# Patient Record
Sex: Female | Born: 1965 | Race: White | Hispanic: No | Smoking: Current every day smoker
Health system: Southern US, Community
[De-identification: ages and names within clinical notes are randomized; demographics above are authoritative.]

## PROBLEM LIST (undated history)

## (undated) DIAGNOSIS — M1990 Unspecified osteoarthritis, unspecified site: Secondary | ICD-10-CM

## (undated) DIAGNOSIS — J449 Chronic obstructive pulmonary disease, unspecified: Secondary | ICD-10-CM

## (undated) HISTORY — PX: CORONARY ANGIOPLASTY WITH STENT PLACEMENT: SHX49

---

## 2003-08-13 ENCOUNTER — Other Ambulatory Visit: Payer: Self-pay

## 2004-05-13 ENCOUNTER — Emergency Department: Payer: Self-pay | Admitting: Emergency Medicine

## 2005-08-19 ENCOUNTER — Emergency Department: Payer: Self-pay | Admitting: Emergency Medicine

## 2008-06-22 ENCOUNTER — Emergency Department: Payer: Self-pay | Admitting: Emergency Medicine

## 2008-10-19 ENCOUNTER — Ambulatory Visit: Payer: Self-pay

## 2008-11-18 ENCOUNTER — Ambulatory Visit: Payer: Self-pay | Admitting: General Practice

## 2010-04-17 ENCOUNTER — Ambulatory Visit: Payer: Self-pay

## 2012-03-12 ENCOUNTER — Emergency Department: Payer: Self-pay | Admitting: Internal Medicine

## 2015-01-03 ENCOUNTER — Other Ambulatory Visit: Payer: Self-pay | Admitting: Medical Oncology

## 2015-01-03 DIAGNOSIS — Z1231 Encounter for screening mammogram for malignant neoplasm of breast: Secondary | ICD-10-CM

## 2015-01-12 ENCOUNTER — Ambulatory Visit: Payer: Self-pay

## 2015-01-18 ENCOUNTER — Ambulatory Visit: Payer: Self-pay | Attending: Medical Oncology

## 2015-02-17 ENCOUNTER — Encounter: Payer: Self-pay | Admitting: *Deleted

## 2015-02-17 ENCOUNTER — Emergency Department: Payer: Self-pay

## 2015-02-17 ENCOUNTER — Emergency Department
Admission: EM | Admit: 2015-02-17 | Discharge: 2015-02-17 | Disposition: A | Discharge status: Home / Self Care | Payer: Self-pay | Attending: Emergency Medicine | Admitting: Emergency Medicine

## 2015-02-17 DIAGNOSIS — Y9389 Activity, other specified: Secondary | ICD-10-CM | POA: Insufficient documentation

## 2015-02-17 DIAGNOSIS — Y9289 Other specified places as the place of occurrence of the external cause: Secondary | ICD-10-CM | POA: Insufficient documentation

## 2015-02-17 DIAGNOSIS — Y998 Other external cause status: Secondary | ICD-10-CM | POA: Insufficient documentation

## 2015-02-17 DIAGNOSIS — Z79899 Other long term (current) drug therapy: Secondary | ICD-10-CM | POA: Insufficient documentation

## 2015-02-17 DIAGNOSIS — S60051A Contusion of right little finger without damage to nail, initial encounter: Secondary | ICD-10-CM | POA: Insufficient documentation

## 2015-02-17 DIAGNOSIS — F17200 Nicotine dependence, unspecified, uncomplicated: Secondary | ICD-10-CM | POA: Insufficient documentation

## 2015-02-17 DIAGNOSIS — S6000XA Contusion of unspecified finger without damage to nail, initial encounter: Secondary | ICD-10-CM

## 2015-02-17 DIAGNOSIS — W01198A Fall on same level from slipping, tripping and stumbling with subsequent striking against other object, initial encounter: Secondary | ICD-10-CM | POA: Insufficient documentation

## 2015-02-17 DIAGNOSIS — S60221A Contusion of right hand, initial encounter: Secondary | ICD-10-CM | POA: Insufficient documentation

## 2015-02-17 HISTORY — DX: Chronic obstructive pulmonary disease, unspecified: J44.9

## 2015-02-17 HISTORY — DX: Unspecified osteoarthritis, unspecified site: M19.90

## 2015-02-17 MED ORDER — TRAMADOL HCL 50 MG PO TABS: 50.0000 mg | ORAL_TABLET | Freq: Four times a day (QID) | ORAL | Status: DC | PRN

## 2015-02-17 NOTE — Discharge Instructions (Signed)
Contusion A contusion is a deep bruise. Contusions happen when an injury causes bleeding under the skin. Symptoms of bruising include pain, swelling, and discolored skin. The skin may turn blue, purple, or yellow. HOME CARE   Rest the injured area.  If told, put ice on the injured area.  Put ice in a plastic bag.  Place a towel between your skin and the bag.  Leave the ice on for 20 minutes, 2-3 times per day.  If told, put light pressure (compression) on the injured area using an elastic bandage. Make sure the bandage is not too tight. Remove it and put it back on as told by your doctor.  If possible, raise (elevate) the injured area above the level of your heart while you are sitting or lying down.  Take over-the-counter and prescription medicines only as told by your doctor. GET HELP IF:  Your symptoms do not get better after several days of treatment.  Your symptoms get worse.  You have trouble moving the injured area. GET HELP RIGHT AWAY IF:   You have very bad pain.  You have a loss of feeling (numbness) in a hand or foot.  Your hand or foot turns pale or cold.   This information is not intended to replace advice given to you by your health care provider. Make sure you discuss any questions you have with your health care provider.   Document Released: 06/19/2007 Document Revised: 09/21/2014 Document Reviewed: 05/18/2014 Elsevier Interactive Patient Education 2016 [employer]  Cryotherapy Cryotherapy is when you put ice on your injury. Ice helps lessen pain and puffiness (swelling) after an injury. Ice works the best when you start using it in the first 24 to 48 hours after an injury. HOME CARE  Put a dry or damp towel between the ice pack and your skin.  You may press gently on the ice pack.  Leave the ice on for no more than 10 to 20 minutes at a time.  Check your skin after 5 minutes to make sure your skin is okay.  Rest at least 20 minutes between ice  pack uses.  Stop using ice when your skin loses feeling (numbness).  Do not use ice on someone who cannot tell you when it hurts. This includes small children and people with memory problems (dementia). GET HELP RIGHT AWAY IF:  You have white spots on your skin.  Your skin turns blue or pale.  Your skin feels waxy or hard.  Your puffiness gets worse. MAKE SURE YOU:   Understand these instructions.  Will watch your condition.  Will get help right away if you are not doing well or get worse.   This information is not intended to replace advice given to you by your health care provider. Make sure you discuss any questions you have with your health care provider.   Document Released: 06/19/2007 Document Revised: 03/25/2011 Document Reviewed: 08/23/2010 Elsevier Interactive Patient Education [employer]   Follow-up with your doctor in [place] if any continued problems. Ice and elevate her hand as needed for pain and swelling. Begin taking tramadol as needed for pain.

## 2015-02-17 NOTE — ED Notes (Signed)
States she slipped last pm  Having pain to right hand and right side of head   No LOC  No swelling noted to hand  Positive bruising noted

## 2015-02-17 NOTE — ED Notes (Signed)
Pt states she slipped on some news paper last night and fell, now states right hand pain

## 2015-02-17 NOTE — ED Provider Notes (Signed)
[facility] Emergency Department Provider Note  ____________________________________________  Time seen: Approximately 10:31 AM  I have reviewed the triage vital signs and the nursing notes.   HISTORY  Chief Complaint Fall and Hand Pain   HPI Tina Miles is a 50 y.o. female complaint of right hand. Patient states she slipped on some newspapers last evening and hit the right side of her head and injury to her right hand. She denies any loss of consciousness or headache. She states she immediately got up and has not had any visual changes, nausea or vomiting. She is continued to have pain in her right hand and bruising is apparent this morning. Patient is not taking any over-the-counter medication for her hand pain. She rates her pain is 7 out of 10. Pain is increased when she makes a fist. She denies any paresthesias into her digits.   Past Medical History  Diagnosis Date  . COPD (chronic obstructive pulmonary disease) (HCC)   . Arthritis     There are no active problems to display for this patient.   Past Surgical History  Procedure Laterality Date  . Coronary angioplasty with stent placement      Current Outpatient Rx  Name  Route  Sig  Dispense  Refill  . albuterol (PROVENTIL HFA;VENTOLIN HFA) 108 (90 Base) MCG/ACT inhaler   Inhalation   Inhale into the lungs every 6 (six) hours as needed for wheezing or shortness of breath.         . clonazePAM (KLONOPIN) 1 MG tablet   Oral   Take 1 mg by mouth 3 (three) times daily as needed for anxiety.         Marland Kitchen doxepin (SINEQUAN) 150 MG capsule   Oral   Take 150 mg by mouth at bedtime.         Marland Kitchen oxybutynin (DITROPAN) 5 MG tablet   Oral   Take 5 mg by mouth 4 (four) times daily.         Marland Kitchen venlafaxine (EFFEXOR) 75 MG tablet   Oral   Take 75 mg by mouth 2 (two) times daily.         . traMADol (ULTRAM) 50 MG tablet   Oral   Take 1 tablet (50 mg total) by mouth every 6 (six) hours as  needed.   20 tablet   0     Allergies Novocain  History reviewed. No pertinent family history.  Social History Social History  Substance Use Topics  . Smoking status: Current Every Day Smoker  . Smokeless tobacco: None  . Alcohol Use: No    Review of Systems Constitutional: No fever/chills Eyes: No visual changes. ENT: No trauma Cardiovascular: Denies chest pain. Respiratory: Denies shortness of breath. Gastrointestinal: No abdominal pain.  No nausea, no vomiting.   Musculoskeletal: Positive right hand pain. Skin: Negative for rash. Neurological: Negative for headaches, focal weakness or numbness.  10-point ROS otherwise negative.  ____________________________________________   PHYSICAL EXAM:  VITAL SIGNS: ED Triage Vitals  Enc Vitals Group     BP 02/17/15 0911 136/98 mmHg     Pulse Rate 02/17/15 0911 103     Resp 02/17/15 0911 18     Temp 02/17/15 0911 98.1 F (36.7 C)     Temp Source 02/17/15 0911 Oral     SpO2 02/17/15 0911 98 %     Weight 02/17/15 0911 163 lb (73.936 kg)     Height 02/17/15 0911  (1.626 m)  Head Cir --      Peak Flow --      Pain Score 02/17/15 0911 7     Pain Loc --      Pain Edu? --      Excl. in GC? --     Constitutional: Alert and oriented. Well appearing and in no acute distress. Eyes: Conjunctivae are normal. PERRL. EOMI. Head: Atraumatic. No edema, abrasions, erythema or ecchymosis. No tenderness on palpation of scalp. Nose: No trauma Mouth/Throat: Mucous membranes are moist.  Oropharynx non-erythematous. Neck: No stridor.  Supple. Range of motion is within normal limits without any restrictions. Cardiovascular: Normal rate, regular rhythm. Grossly normal heart sounds.  Good peripheral circulation. Respiratory: Normal respiratory effort.  No retractions. Lungs CTAB. Musculoskeletal: No lower extremity tenderness nor edema.  No joint effusions. Right hand dorsum fifth digit and fifth metacarpal with mild tenderness  and ecchymosis. There is minimal soft tissue swelling. Patient is able to make a complete fist with some discomfort. Motor sensory function intact. Neurologic:  Normal speech and language. No gross focal neurologic deficits are appreciated. No gait instability. Skin:  Skin is warm, dry and intact. Ecchymosis right hand. Psychiatric: Mood and affect are normal. Speech and behavior are normal.  ____________________________________________   LABS (all labs ordered are listed, but only abnormal results are displayed)  Labs Reviewed - No data to display   RADIOLOGY  Right hand per radiologist is negative for fracture or dislocation. ____________________________________________   PROCEDURES  Procedure(s) performed: None  Critical Care performed: No  ____________________________________________   INITIAL IMPRESSION / ASSESSMENT AND PLAN / ED COURSE  Pertinent labs & imaging results that were available during my care of the patient were reviewed by me and considered in my medical decision making (see chart for details).  Patient is given prescription for tramadol to be taken as needed for pain. She is encouraged to use ice and elevate as needed for pain. She'll follow-up with her primary care doctor if any continued problems. ____________________________________________   FINAL CLINICAL IMPRESSION(S) / ED DIAGNOSES  Final diagnoses:  Contusion of right hand including fingers, initial encounter      Tommi Rumps, PA-C 02/17/15 1123  Jennye Moccasin, MD 02/17/15 [id number]

## 2016-01-30 ENCOUNTER — Emergency Department
Admission: EM | Admit: 2016-01-30 | Discharge: 2016-01-30 | Disposition: A | Discharge status: Home / Self Care | Payer: Self-pay | Attending: Student in an Organized Health Care Education/Training Program | Admitting: Student in an Organized Health Care Education/Training Program

## 2016-01-30 ENCOUNTER — Encounter: Payer: Self-pay | Admitting: Emergency Medicine

## 2016-01-30 DIAGNOSIS — F17200 Nicotine dependence, unspecified, uncomplicated: Secondary | ICD-10-CM | POA: Insufficient documentation

## 2016-01-30 DIAGNOSIS — J40 Bronchitis, not specified as acute or chronic: Secondary | ICD-10-CM | POA: Insufficient documentation

## 2016-01-30 MED ORDER — SULFAMETHOXAZOLE-TRIMETHOPRIM 800-160 MG PO TABS: 1.0000 | ORAL_TABLET | Freq: Two times a day (BID) | ORAL | 0 refills | Status: DC

## 2016-01-30 MED ORDER — PSEUDOEPH-BROMPHEN-DM 30-2-10 MG/5ML PO SYRP: 5.0000 mL | ORAL_SOLUTION | Freq: Four times a day (QID) | ORAL | 0 refills | Status: DC | PRN

## 2016-01-30 NOTE — ED Notes (Signed)
See triage note  States she thought she had the flu about 2 weeks ago but now having pain with some swelling to left side of face and left ear  Afebrile on arrival

## 2016-01-30 NOTE — ED Provider Notes (Signed)
[facility] Emergency Department Provider Note   ____________________________________________   First MD Initiated Contact with Patient 01/30/16 1617     (approximate)  I have reviewed the triage vital signs and the nursing notes.   HISTORY  Chief Complaint Generalized Body Aches    HPI Tina Miles is a 51 y.o. female is complaining of 2 weeks of generalized body aches nasal congestion and a nonproductive cough. Patient intermittent fever and chills this complaint. Patient denies nausea vomiting or diarrhea. Patient state coughing so bad she has not smoked cigarettes in 2 weeks. No other palliative measures for her complaint. Patient rates her pain discomfort as a 7/10. Patient describes the pain as "achy".Patient has COPD.   Past Medical History:  Diagnosis Date  . Arthritis   . COPD (chronic obstructive pulmonary disease) (HCC)     There are no active problems to display for this patient.   Past Surgical History:  Procedure Laterality Date  . CORONARY ANGIOPLASTY WITH STENT PLACEMENT      Prior to Admission medications   Medication Sig Start Date End Date Taking? Authorizing Provider  albuterol (PROVENTIL HFA;VENTOLIN HFA) 108 (90 Base) MCG/ACT inhaler Inhale into the lungs every 6 (six) hours as needed for wheezing or shortness of breath.    Historical Provider, MD  brompheniramine-pseudoephedrine-DM 30-2-10 MG/5ML syrup Take 5 mLs by mouth 4 (four) times daily as needed. 01/30/16   Joni Reining, PA-C  clonazePAM (KLONOPIN) 1 MG tablet Take 1 mg by mouth 3 (three) times daily as needed for anxiety.    Historical Provider, MD  doxepin (SINEQUAN) 150 MG capsule Take 150 mg by mouth at bedtime.    Historical Provider, MD  oxybutynin (DITROPAN) 5 MG tablet Take 5 mg by mouth 4 (four) times daily.    Historical Provider, MD  sulfamethoxazole-trimethoprim (BACTRIM DS,SEPTRA DS) 800-160 MG tablet Take 1 tablet by mouth 2 (two) times daily. 01/30/16    Joni Reining, PA-C  traMADol (ULTRAM) 50 MG tablet Take 1 tablet (50 mg total) by mouth every 6 (six) hours as needed. 02/17/15   Tommi Rumps, PA-C  venlafaxine (EFFEXOR) 75 MG tablet Take 75 mg by mouth 2 (two) times daily.    Historical Provider, MD    Allergies Novocain [procaine]  No family history on file.  Social History Social History  Substance Use Topics  . Smoking status: Current Some Day Smoker  . Smokeless tobacco: Never Used  . Alcohol use No    Review of Systems Constitutional: No fever/chills Eyes: No visual changes. ENT: No sore throat. Cardiovascular: Denies chest pain. Respiratory: Denies shortness of breath. Gastrointestinal: No abdominal pain.  No nausea, no vomiting.  No diarrhea.  No constipation. Genitourinary: Negative for dysuria. Musculoskeletal: Negative for back pain. Skin: Negative for rash. Neurological: Negative for headaches, focal weakness or numbness. Psychiatric:Anxiety  ____________________________________________   PHYSICAL EXAM:  VITAL SIGNS: ED Triage Vitals  Enc Vitals Group     BP 01/30/16 1601 (!) 160/84     Pulse Rate 01/30/16 1601 97     Resp 01/30/16 1601 20     Temp --      Temp Source 01/30/16 1601 Oral     SpO2 01/30/16 1601 99 %     Weight 01/30/16 1602 149 lb (67.6 kg)     Height 01/30/16 1602 5\' 4"  (1.626 m)     Head Circumference --      Peak Flow --      Pain  Score 01/30/16 1602 7     Pain Loc --      Pain Edu? --      Excl. in GC? --     Constitutional: Alert and oriented. Well appearing and in no acute distress. Eyes: Conjunctivae are normal. PERRL. EOMI. Head: Atraumatic. Nose: No congestion/rhinnorhea. Mouth/Throat: Mucous membranes are moist.  Oropharynx non-erythematous. Neck: No stridor. No cervical spine tenderness to palpation. Hematological/Lymphatic/Immunilogical: No cervical lymphadenopathy. Cardiovascular: Normal rate, regular rhythm. Grossly normal heart sounds.  Good peripheral  circulation. Elevated blood pressure Respiratory: Normal respiratory effort.  No retractions. Diffuse rales. Nonproductive cough Gastrointestinal: Soft and nontender. No distention. No abdominal bruits. No CVA tenderness. Musculoskeletal: No lower extremity tenderness nor edema.  No joint effusions. Neurologic:  Normal speech and language. No gross focal neurologic deficits are appreciated. No gait instability. Skin:  Skin is warm, dry and intact. No rash noted. Psychiatric: Mood and affect are normal. Speech and behavior are normal.  ____________________________________________   LABS (all labs ordered are listed, but only abnormal results are displayed)  Labs Reviewed - No data to display ____________________________________________  EKG   ____________________________________________  RADIOLOGY   ____________________________________________   PROCEDURES  Procedure(s) performed: None  Procedures  Critical Care performed: No  ____________________________________________   INITIAL IMPRESSION / ASSESSMENT AND PLAN / ED COURSE  Pertinent labs & imaging results that were available during my care of the patient were reviewed by me and considered in my medical decision making (see chart for details).  Bronchitis. Patient given discharge care instructions. Patient given prescription for Bactrim DS) felt DM. Patient [facility]  Clinical Course      ____________________________________________   FINAL CLINICAL IMPRESSION(S) / ED DIAGNOSES  Final diagnoses:  Bronchitis      NEW MEDICATIONS STARTED DURING THIS VISIT:  New Prescriptions   BROMPHENIRAMINE-PSEUDOEPHEDRINE-DM 30-2-10 MG/5ML SYRUP    Take 5 mLs by mouth 4 (four) times daily as needed.   SULFAMETHOXAZOLE-TRIMETHOPRIM (BACTRIM DS,SEPTRA DS) 800-160 MG TABLET    Take 1 tablet by mouth 2 (two) times daily.     Note:  This document was prepared using Dragon voice  recognition software and may include unintentional dictation errors.    Joni Reiningonald K Hau Sanor, PA-C 01/30/16 1625    Willy EddyPatrick Robinson, MD 01/30/16 [phone]

## 2016-01-30 NOTE — ED Triage Notes (Signed)
Pt presents with generalized body aches and cough for two weeks.

## 2017-03-08 ENCOUNTER — Emergency Department
Admission: EM | Admit: 2017-03-08 | Discharge: 2017-03-08 | Disposition: A | Discharge status: Home / Self Care | Payer: Self-pay | Attending: Emergency Medicine | Admitting: Emergency Medicine

## 2017-03-08 ENCOUNTER — Emergency Department: Payer: Self-pay

## 2017-03-08 ENCOUNTER — Encounter: Payer: Self-pay | Admitting: Emergency Medicine

## 2017-03-08 ENCOUNTER — Other Ambulatory Visit: Payer: Self-pay

## 2017-03-08 DIAGNOSIS — J449 Chronic obstructive pulmonary disease, unspecified: Secondary | ICD-10-CM | POA: Insufficient documentation

## 2017-03-08 DIAGNOSIS — Z79899 Other long term (current) drug therapy: Secondary | ICD-10-CM | POA: Insufficient documentation

## 2017-03-08 DIAGNOSIS — F17210 Nicotine dependence, cigarettes, uncomplicated: Secondary | ICD-10-CM | POA: Insufficient documentation

## 2017-03-08 DIAGNOSIS — Z955 Presence of coronary angioplasty implant and graft: Secondary | ICD-10-CM | POA: Insufficient documentation

## 2017-03-08 DIAGNOSIS — J40 Bronchitis, not specified as acute or chronic: Secondary | ICD-10-CM | POA: Insufficient documentation

## 2017-03-08 MED ORDER — PREDNISONE 10 MG (21) PO TBPK: ORAL_TABLET | ORAL | 0 refills | Status: DC

## 2017-03-08 MED ORDER — IPRATROPIUM-ALBUTEROL 0.5-2.5 (3) MG/3ML IN SOLN
3.0000 mL | Freq: Once | RESPIRATORY_TRACT | Status: AC
  Administered 2017-03-08: 3 mL via RESPIRATORY_TRACT
  Filled 2017-03-08: qty 3

## 2017-03-08 MED ORDER — DEXAMETHASONE SODIUM PHOSPHATE 10 MG/ML IJ SOLN
10.0000 mg | Freq: Once | INTRAMUSCULAR | Status: AC
  Administered 2017-03-08: 10 mg via INTRAMUSCULAR
  Filled 2017-03-08: qty 1

## 2017-03-08 MED ORDER — BENZONATATE 100 MG PO CAPS: 100.0000 mg | ORAL_CAPSULE | Freq: Three times a day (TID) | ORAL | 0 refills | Status: DC | PRN

## 2017-03-08 MED ORDER — GUAIFENESIN-CODEINE 100-10 MG/5ML PO SOLN: 5.0000 mL | Freq: Three times a day (TID) | ORAL | 0 refills | Status: DC | PRN

## 2017-03-08 NOTE — ED Provider Notes (Signed)
[facility] Emergency Department Provider Note ____________________________________________  Time seen: 1512  I have reviewed the triage vital signs and the nursing notes.  HISTORY  Chief Complaint  Cough  HPI Tina Miles is a 52 y.o. female presents to the ED with a 3-4-day complaint of nonproductive cough.  Denies any intermittent fevers, chills, or productive cough.  She gives a history of COPD and chronic bronchitis.  Past Medical History:  Diagnosis Date  . Arthritis   . COPD (chronic obstructive pulmonary disease) (HCC)     There are no active problems to display for this patient.   Past Surgical History:  Procedure Laterality Date  . CORONARY ANGIOPLASTY WITH STENT PLACEMENT      Prior to Admission medications   Medication Sig Start Date End Date Taking? Authorizing Provider  albuterol (PROVENTIL HFA;VENTOLIN HFA) 108 (90 Base) MCG/ACT inhaler Inhale into the lungs every 6 (six) hours as needed for wheezing or shortness of breath.    [provider]  benzonatate (TESSALON PERLES) 100 MG capsule Take 1 capsule (100 mg total) by mouth 3 (three) times daily as needed for cough (Take 1-2 per dose). 03/08/17   Macgregor Aeschliman, Charlesetta Ivory, PA-C  brompheniramine-pseudoephedrine-DM 30-2-10 MG/5ML syrup Take 5 mLs by mouth 4 (four) times daily as needed. 01/30/16   Joni Reining, PA-C  clonazePAM (KLONOPIN) 1 MG tablet Take 1 mg by mouth 3 (three) times daily as needed for anxiety.    [provider]  doxepin (SINEQUAN) 150 MG capsule Take 150 mg by mouth at bedtime.    [provider]  guaiFENesin-codeine 100-10 MG/5ML syrup Take 5 mLs by mouth 3 (three) times daily as needed for cough. 03/08/17   Darivs Lunden, Charlesetta Ivory, PA-C  oxybutynin (DITROPAN) 5 MG tablet Take 5 mg by mouth 4 (four) times daily.    [provider]  predniSONE (STERAPRED UNI-PAK 21 TAB) 10 MG (21) TBPK tablet 6-day taper as directed. 03/08/17   Ashanty Coltrane,  Charlesetta Ivory, PA-C  sulfamethoxazole-trimethoprim (BACTRIM DS,SEPTRA DS) 800-160 MG tablet Take 1 tablet by mouth 2 (two) times daily. 01/30/16   Joni Reining, PA-C  traMADol (ULTRAM) 50 MG tablet Take 1 tablet (50 mg total) by mouth every 6 (six) hours as needed. 02/17/15   Tommi Rumps, PA-C  venlafaxine (EFFEXOR) 75 MG tablet Take 75 mg by mouth 2 (two) times daily.    [provider]    Allergies Novocain [procaine] and Sulfa antibiotics  No family history on file.  Social History Social History   Tobacco Use  . Smoking status: Current Every Day Smoker    Packs/day: 1.00    Types: Cigarettes  . Smokeless tobacco: Never Used  Substance Use Topics  . Alcohol use: No  . Drug use: Not on file    Review of Systems  Constitutional: Negative for fever. Eyes: Negative for visual changes. ENT: Negative for sore throat. Cardiovascular: Negative for chest pain. Respiratory: Positive for shortness of breath. Gastrointestinal: Negative for abdominal pain, vomiting and diarrhea. Genitourinary: Negative for dysuria. Musculoskeletal: Negative for back pain. Skin: Negative for rash. Neurological: Negative for headaches, focal weakness or numbness. ____________________________________________  PHYSICAL EXAM:  VITAL SIGNS: ED Triage Vitals  Enc Vitals Group     BP 03/08/17 1442 (!) 160/86     Pulse Rate 03/08/17 1442 (!) 102     Resp 03/08/17 1442 20     Temp 03/08/17 1442 98.2 F (36.8 C)     Temp  Source 03/08/17 1442 Oral     SpO2 03/08/17 1442 95 %     Weight 03/08/17 1443 165 lb (74.8 kg)     Height 03/08/17 1443 5\' 4"  (1.626 m)     Head Circumference --      Peak Flow --      Pain Score 03/08/17 1443 8     Pain Loc --      Pain Edu? --      Excl. in GC? --     Constitutional: Alert and oriented. Well appearing and in no distress. Head: Normocephalic and atraumatic. Eyes: Conjunctivae are normal. Normal extraocular movements Neck: Supple. No  thyromegaly. Cardiovascular: Normal rate, regular rhythm. Normal distal pulses. Respiratory: Normal respiratory effort. No wheezes/rales mild bilateral rhonchi. Gastrointestinal: Soft and nontender. No distention. Musculoskeletal: Nontender with normal range of motion in all extremities.  Neurologic:  Normal gait without ataxia. Normal speech and language. No gross focal neurologic deficits are appreciated. Skin:  Skin is warm, dry and intact. No rash noted. Psychiatric: Mood and affect are normal. Patient exhibits appropriate insight and judgment. ____________________________________________   RADIOLOGY  CXR IMPRESSION: Mild bronchitic change in the lungs. No focal infiltrate or other acute abnormalities. ____________________________________________  PROCEDURES  Procedures DuoNeb x 1 Decadron 10 mg IM ____________________________________________  INITIAL IMPRESSION / ASSESSMENT AND PLAN / ED COURSE  Patient with ED evaluation of 4-day complaint of productive cough and some shortness of breath.  Patient's x-ray reveals some mild bronchitic changes but no acute infectious process.  She reports improvement after DuoNeb is provided in the ED.  She will be discharged with prescriptions for prednisone, Tessalon Perles, and codeine-guaifenesin cough syrup.  Follow-up with her primary provider and continue to dose her home medications as prescribed.  Return precautions have been reviewed with the patient and her daughter. ____________________________________________  FINAL CLINICAL IMPRESSION(S) / ED DIAGNOSES  Final diagnoses:  Bronchitis      Karmen StabsMenshew, Charlesetta IvoryJenise V Bacon, PA-C 03/09/17 0043    Emily FilbertWilliams, Jonathan E, MD 03/11/17 [phone]

## 2017-03-08 NOTE — Discharge Instructions (Signed)
Your chest x-ray reveals some mild bronchitis. Take the prescription meds as directed. Follow-up with your provider for continued symptoms.

## 2017-03-08 NOTE — ED Triage Notes (Signed)
Cough x 4 to 5 days.

## 2017-03-08 NOTE — ED Notes (Signed)
Pt with left rib pain with coughing.

## 2017-06-10 ENCOUNTER — Emergency Department: Payer: Self-pay

## 2017-06-10 ENCOUNTER — Other Ambulatory Visit: Payer: Self-pay

## 2017-06-10 ENCOUNTER — Emergency Department
Admission: EM | Admit: 2017-06-10 | Discharge: 2017-06-10 | Disposition: A | Discharge status: Home / Self Care | Payer: Self-pay | Attending: Student in an Organized Health Care Education/Training Program | Admitting: Student in an Organized Health Care Education/Training Program

## 2017-06-10 DIAGNOSIS — J449 Chronic obstructive pulmonary disease, unspecified: Secondary | ICD-10-CM | POA: Insufficient documentation

## 2017-06-10 DIAGNOSIS — Z79899 Other long term (current) drug therapy: Secondary | ICD-10-CM | POA: Insufficient documentation

## 2017-06-10 DIAGNOSIS — Y9389 Activity, other specified: Secondary | ICD-10-CM | POA: Insufficient documentation

## 2017-06-10 DIAGNOSIS — Y999 Unspecified external cause status: Secondary | ICD-10-CM | POA: Insufficient documentation

## 2017-06-10 DIAGNOSIS — W228XXA Striking against or struck by other objects, initial encounter: Secondary | ICD-10-CM | POA: Insufficient documentation

## 2017-06-10 DIAGNOSIS — Y92009 Unspecified place in unspecified non-institutional (private) residence as the place of occurrence of the external cause: Secondary | ICD-10-CM | POA: Insufficient documentation

## 2017-06-10 DIAGNOSIS — F17210 Nicotine dependence, cigarettes, uncomplicated: Secondary | ICD-10-CM | POA: Insufficient documentation

## 2017-06-10 DIAGNOSIS — S62306A Unspecified fracture of fifth metacarpal bone, right hand, initial encounter for closed fracture: Secondary | ICD-10-CM | POA: Insufficient documentation

## 2017-06-10 MED ORDER — OXYCODONE-ACETAMINOPHEN 5-325 MG PO TABS: 1.0000 | ORAL_TABLET | Freq: Four times a day (QID) | ORAL | 0 refills | Status: AC | PRN

## 2017-06-10 MED ORDER — IBUPROFEN 600 MG PO TABS
600.0000 mg | ORAL_TABLET | Freq: Once | ORAL | Status: AC
  Administered 2017-06-10: 600 mg via ORAL
  Filled 2017-06-10: qty 1

## 2017-06-10 MED ORDER — IBUPROFEN 600 MG PO TABS: 600.0000 mg | ORAL_TABLET | Freq: Three times a day (TID) | ORAL | 0 refills | Status: AC | PRN

## 2017-06-10 MED ORDER — TRAMADOL HCL 50 MG PO TABS
50.0000 mg | ORAL_TABLET | Freq: Once | ORAL | Status: AC
  Administered 2017-06-10: 50 mg via ORAL
  Filled 2017-06-10: qty 1
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
## 2017-06-10 NOTE — ED Notes (Signed)
Splint applied by Medic Mellody Dance

## 2017-06-10 NOTE — Discharge Instructions (Addendum)
Contact orthopedic clinic for appointment.

## 2017-06-10 NOTE — ED Notes (Signed)
Esign not working at this time. Pt verbalized discharge instructions and has no questions at this time. 
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
## 2017-11-17 ENCOUNTER — Emergency Department
Admission: EM | Admit: 2017-11-17 | Discharge: 2017-11-17 | Disposition: A | Discharge status: Home / Self Care | Payer: Self-pay | Attending: Emergency Medicine | Admitting: Emergency Medicine

## 2017-11-17 ENCOUNTER — Emergency Department: Payer: Self-pay

## 2017-11-17 ENCOUNTER — Encounter: Payer: Self-pay | Admitting: Emergency Medicine

## 2017-11-17 DIAGNOSIS — Z955 Presence of coronary angioplasty implant and graft: Secondary | ICD-10-CM | POA: Insufficient documentation

## 2017-11-17 DIAGNOSIS — F17210 Nicotine dependence, cigarettes, uncomplicated: Secondary | ICD-10-CM | POA: Insufficient documentation

## 2017-11-17 DIAGNOSIS — R05 Cough: Secondary | ICD-10-CM | POA: Insufficient documentation

## 2017-11-17 DIAGNOSIS — J441 Chronic obstructive pulmonary disease with (acute) exacerbation: Secondary | ICD-10-CM

## 2017-11-17 DIAGNOSIS — R0602 Shortness of breath: Secondary | ICD-10-CM | POA: Insufficient documentation

## 2017-11-17 DIAGNOSIS — M67912 Unspecified disorder of synovium and tendon, left shoulder: Secondary | ICD-10-CM

## 2017-11-17 LAB — CBC
HCT: 43.6 % (ref 36.0–46.0)
Hemoglobin: 14.5 g/dL (ref 12.0–15.0)
MCH: 30 pg (ref 26.0–34.0)
MCHC: 33.3 g/dL (ref 30.0–36.0)
MCV: 90.1 fL (ref 80.0–100.0)
NRBC: 0 % (ref 0.0–0.2)
Platelets: 217 10*3/uL (ref 150–400)
RBC: 4.84 MIL/uL (ref 3.87–5.11)
RDW: 13.7 % (ref 11.5–15.5)
WBC: 7.9 10*3/uL (ref 4.0–10.5)

## 2017-11-17 LAB — HEPATIC FUNCTION PANEL
ALBUMIN: 4.2 g/dL (ref 3.5–5.0)
ALK PHOS: 98 U/L (ref 38–126)
ALT: 16 U/L (ref 0–44)
AST: 15 U/L (ref 15–41)
BILIRUBIN TOTAL: 0.3 mg/dL (ref 0.3–1.2)
Total Protein: 7.3 g/dL (ref 6.5–8.1)

## 2017-11-17 LAB — BASIC METABOLIC PANEL
Anion gap: 8 (ref 5–15)
BUN: 9 mg/dL (ref 6–20)
CHLORIDE: 105 mmol/L (ref 98–111)
CO2: 27 mmol/L (ref 22–32)
CREATININE: 0.92 mg/dL (ref 0.44–1.00)
Calcium: 9.1 mg/dL (ref 8.9–10.3)
GFR calc non Af Amer: >60 mL/min (ref >60)
GLUCOSE: 118 mg/dL — AB (ref 70–99)
Potassium: 3.9 mmol/L (ref 3.5–5.1)
Sodium: 140 mmol/L (ref 135–145)

## 2017-11-17 LAB — TROPONIN I: Troponin I: <0.03 ng/mL (ref <0.03)

## 2017-11-17 MED ORDER — IPRATROPIUM-ALBUTEROL 0.5-2.5 (3) MG/3ML IN SOLN
3.0000 mL | Freq: Once | RESPIRATORY_TRACT | Status: AC
  Administered 2017-11-17: 3 mL via RESPIRATORY_TRACT
  Filled 2017-11-17: qty 3

## 2017-11-17 MED ORDER — LEVOFLOXACIN 750 MG PO TABS: 750.0000 mg | ORAL_TABLET | Freq: Every day | ORAL | 0 refills | Status: DC

## 2017-11-17 MED ORDER — IOHEXOL 300 MG/ML  SOLN
75.0000 mL | Freq: Once | INTRAMUSCULAR | Status: AC | PRN
  Administered 2017-11-17: 75 mL via INTRAVENOUS

## 2017-11-17 MED ORDER — LEVOFLOXACIN 750 MG PO TABS
750.0000 mg | ORAL_TABLET | Freq: Once | ORAL | Status: AC
  Administered 2017-11-17: 750 mg via ORAL
  Filled 2017-11-17: qty 1

## 2017-11-17 MED ORDER — PREDNISONE 20 MG PO TABS
60.0000 mg | ORAL_TABLET | Freq: Once | ORAL | Status: AC
  Administered 2017-11-17: 60 mg via ORAL
  Filled 2017-11-17: qty 3

## 2017-11-17 MED ORDER — PREDNISONE 10 MG (21) PO TBPK: ORAL_TABLET | ORAL | 0 refills | Status: DC

## 2017-11-17 NOTE — ED Provider Notes (Signed)
[facility] Emergency Department Provider Note       Time seen: ----------------------------------------- 4:14 PM on 11/17/2017 -----------------------------------------   I have reviewed the triage vital signs and the nursing notes.  HISTORY   Chief Complaint Shoulder Pain and Chest Pain    HPI Tina Miles is a 52 y.o. female with a history of arthritis, COPD who presents to the ED for chest pain that radiates into her left arm.  Patient states the pain is been present for several weeks.  She has particular pain with range of motion of the left shoulder.  She does have some shortness of breath has been coughing with a history of COPD.  Main complaint is cough and pain with range of motion of the left shoulder.  Past Medical History:  Diagnosis Date  . Arthritis   . COPD (chronic obstructive pulmonary disease) (HCC)     There are no active problems to display for this patient.   Past Surgical History:  Procedure Laterality Date  . CORONARY ANGIOPLASTY WITH STENT PLACEMENT      Allergies Novocain [procaine] and Sulfa antibiotics  Social History Social History   Tobacco Use  . Smoking status: Current Every Day Smoker    Packs/day: 1.00    Types: Cigarettes  . Smokeless tobacco: Never Used  Substance Use Topics  . Alcohol use: No  . Drug use: Not on file   Review of Systems Constitutional: Negative for fever. Cardiovascular: Negative for chest pain. Respiratory: Positive for shortness of breath and cough Gastrointestinal: Negative for abdominal pain, vomiting and diarrhea. Musculoskeletal: Positive for left shoulder pain Skin: Negative for rash. Neurological: Negative for headaches, focal weakness or numbness.  All systems negative/normal/unremarkable except as stated in the HPI  ____________________________________________   PHYSICAL EXAM:  VITAL SIGNS: ED Triage Vitals [11/17/17 1056]  Enc Vitals Group     BP 134/74      Pulse Rate 99     Resp 20     Temp 98.2 F (36.8 C)     Temp Source Oral     SpO2 98 %     Weight 136 lb (61.7 kg)     Height 5\' 2"  (1.575 m)     Head Circumference      Peak Flow      Pain Score 9     Pain Loc      Pain Edu?      Excl. in GC?    Constitutional: Alert and oriented. Well appearing and in no distress. Eyes: Conjunctivae are normal. Normal extraocular movements. ENT   Head: Normocephalic and atraumatic.   Nose: No congestion/rhinnorhea.   Mouth/Throat: Mucous membranes are moist.   Neck: No stridor. Cardiovascular: Normal rate, regular rhythm. No murmurs, rubs, or gallops. Respiratory: Normal respiratory effort without tachypnea nor retractions.  Mild expiratory wheezing Gastrointestinal: Soft and nontender. Normal bowel sounds Musculoskeletal: Limited range of motion of the left shoulder with some crepitus Neurologic:  Normal speech and language. No gross focal neurologic deficits are appreciated.  Skin:  Skin is warm, dry and intact. No rash noted. Psychiatric: Mood and affect are normal. Speech and behavior are normal.  ____________________________________________  EKG: Interpreted by me.  Sinus rhythm rate of 100 bpm, normal PR interval, normal QRS, normal QT  ____________________________________________  ED COURSE:  As part of my medical decision making, I reviewed the following data within the electronic MEDICAL RECORD NUMBER History obtained from family if available, nursing notes, old chart and ekg, as  well as notes from prior ED visits. Patient presented for shoulder pain and likely COPD exacerbation, we will assess with labs and imaging as indicated at this time.   Procedures ____________________________________________   LABS (pertinent positives/negatives)  Labs Reviewed  BASIC METABOLIC PANEL - Abnormal; Notable for the following components:      Result Value   Glucose, Bld 118 (*)    All other components within normal limits  CBC   TROPONIN I  HEPATIC FUNCTION PANEL    RADIOLOGY Images were viewed by me CT chest IMPRESSION: 1. Faint centrilobular ground-glass nodules predominantly in upper lobes. Findings probably represent respiratory bronchiolitis given history of smoking or possibly mild infectious bronchiolitis. No consolidation. 2. Aortic Atherosclerosis (ICD10-I70.0) and Emphysema (ICD10-J43.9).  ____________________________________________  DIFFERENTIAL DIAGNOSIS   Bronchiolitis, bronchitis, pneumonia, PE, pneumothorax, lung cancer, rotator cuff injury, arthritis  FINAL ASSESSMENT AND PLAN  COPD exacerbation, rotator cuff injury   Plan: The patient had presented for shortness of breath with cough and left shoulder pain. Patient's labs did not reveal any acute process. Patient's imaging reveals what appears to be respiratory bronchiolitis on CT.  She also has pain with range of motion of the left shoulder which is likely rotator cuff related.  She is cleared for outpatient follow-up.   Ulice Dash, MD   Note: This note was generated in part or whole with voice recognition software. Voice recognition is usually quite accurate but there are transcription errors that can and very often do occur. I apologize for any typographical errors that were not detected and corrected.     Emily Filbert, MD 11/17/17 1640

## 2017-11-17 NOTE — ED Notes (Signed)
Pt c/o chest pain that radiates into left arm - the pain has been present for 2 weeks - reports N/V but this is normal per pt - denies dizziness - reports shortness of breath but relates to dx of CPOD Reports headache that moves into left side of neck and left arm - she reports difficulty moving left arm

## 2017-11-17 NOTE — ED Triage Notes (Signed)
Pt reports for the last 2 weeks has had some pain in her left shoulder that radiates into her chest. Pt denies obvious injuries, heavy lifting or other sx;s. Pt unsure of what is causing the pain.

## 2017-11-17 NOTE — ED Notes (Signed)
CT notified IV being placed now in triage.

## 2018-01-17 IMAGING — CR DG HAND COMPLETE 3+V*R*
3 series · 3 of 3 positions shown · non-contrast
Comparison: 11/18/2008

CLINICAL DATA: Fall last night, injury, medial right hand pain

EXAM:
RIGHT HAND - COMPLETE 3+ VIEW

[hand ap]
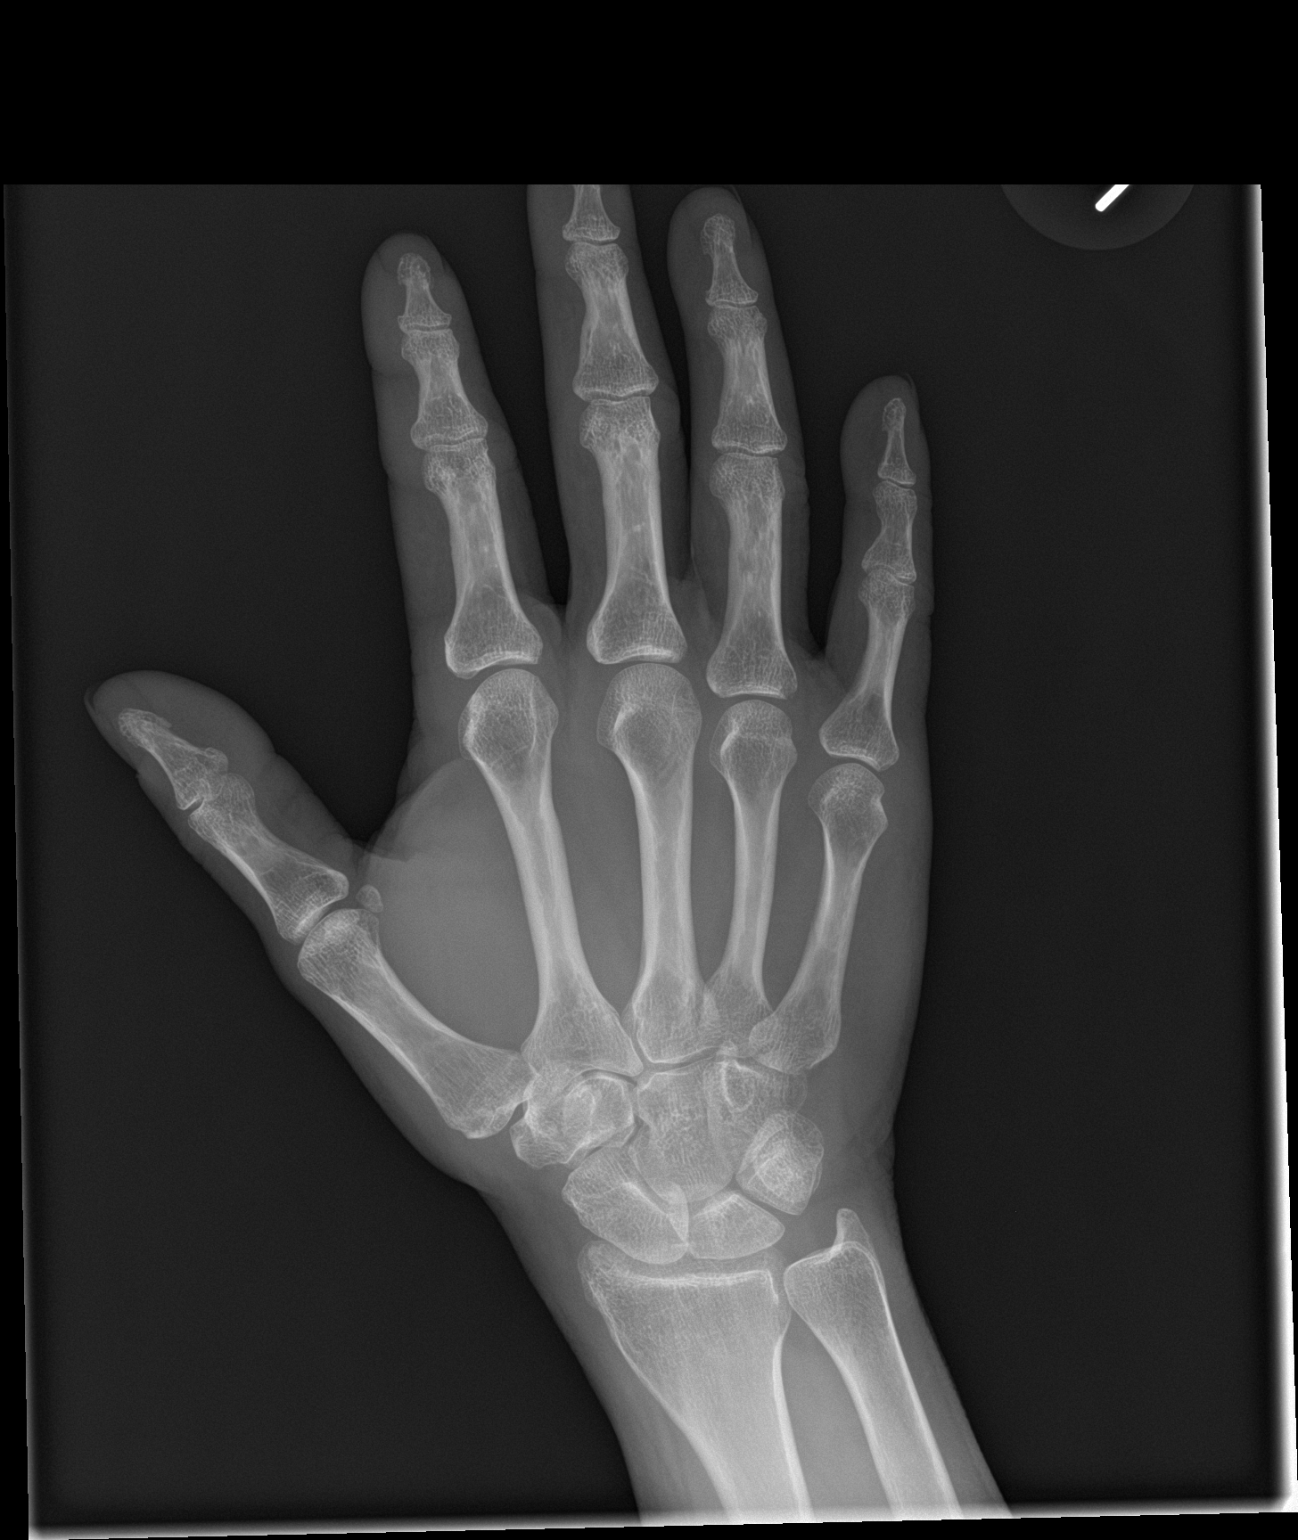

[hand obl]
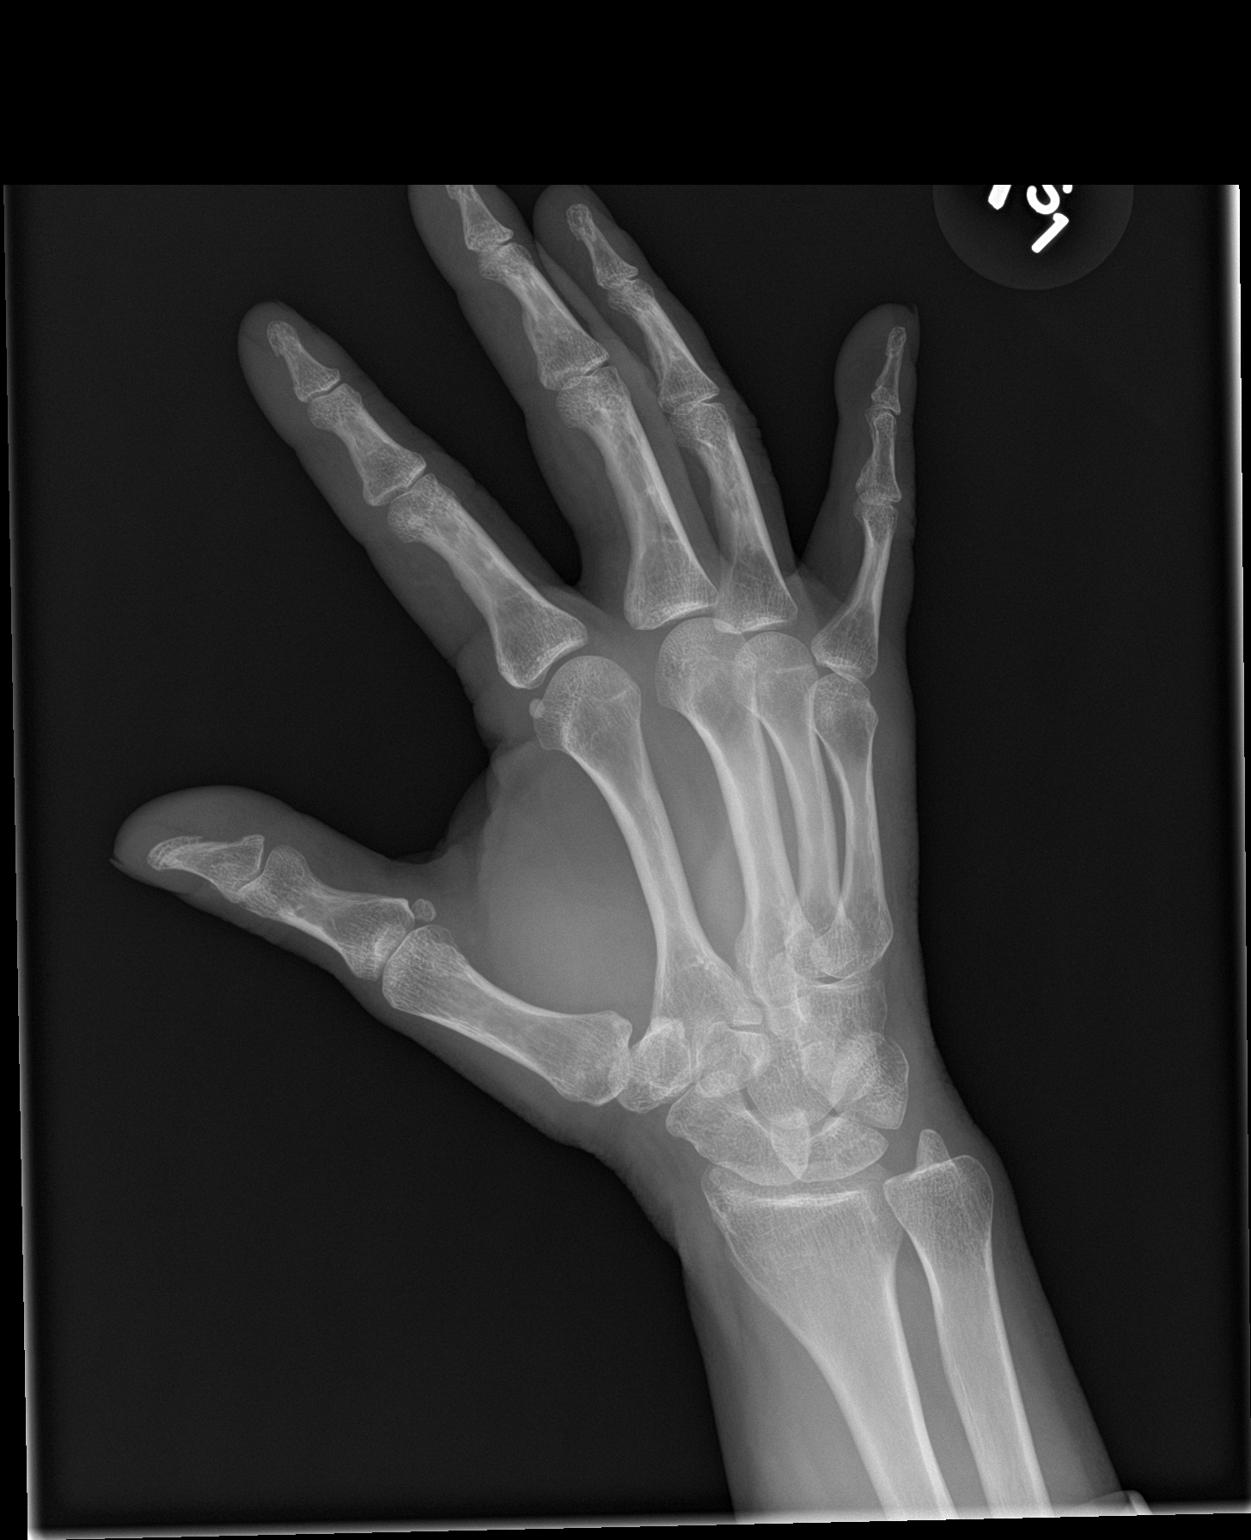

[hand lat]
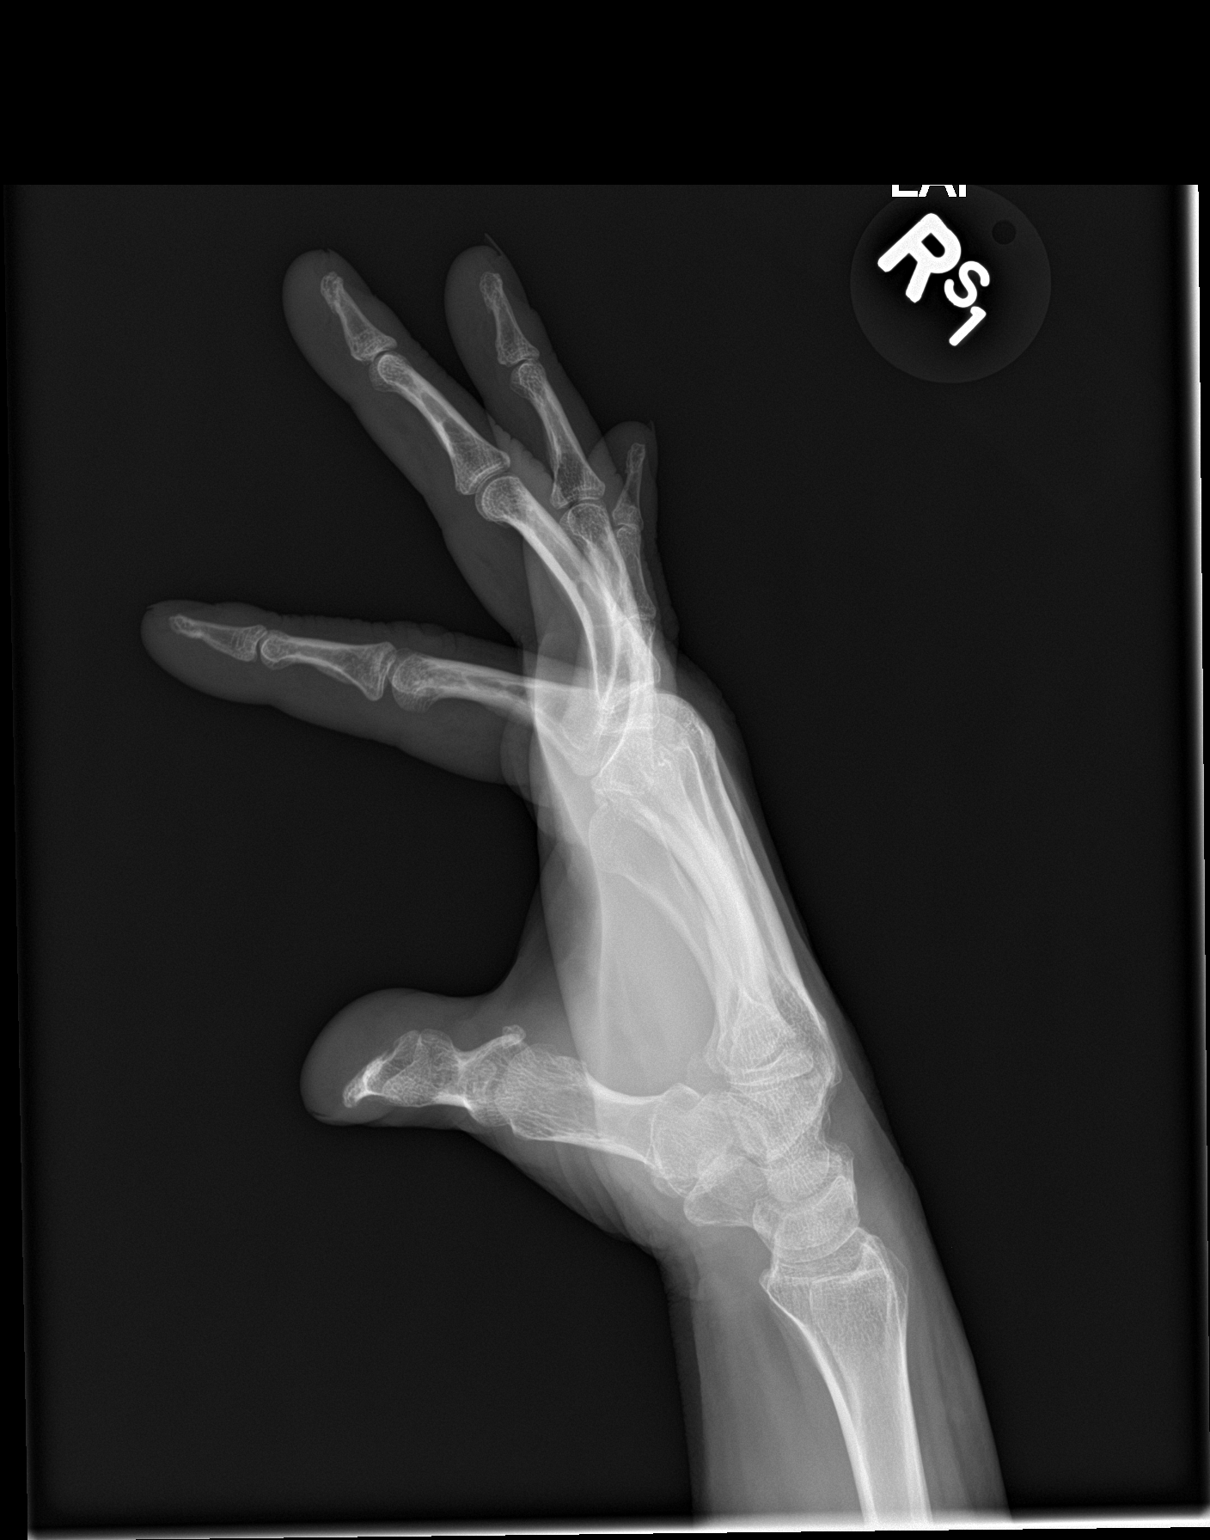

[3 of 3 positions shown; findings below may reference images not displayed]

FINDINGS: Three views of the right hand submitted. No acute fracture or
subluxation. No radiopaque foreign body. No periosteal reaction or
bony erosion.
IMPRESSION: Negative.

## 2018-08-19 ENCOUNTER — Emergency Department
Admission: EM | Admit: 2018-08-19 | Discharge: 2018-08-19 | Disposition: A | Discharge status: Home / Self Care | Payer: Self-pay | Attending: Emergency Medicine | Admitting: Emergency Medicine

## 2018-08-19 ENCOUNTER — Emergency Department: Payer: Self-pay

## 2018-08-19 ENCOUNTER — Other Ambulatory Visit: Payer: Self-pay

## 2018-08-19 DIAGNOSIS — J441 Chronic obstructive pulmonary disease with (acute) exacerbation: Secondary | ICD-10-CM | POA: Insufficient documentation

## 2018-08-19 DIAGNOSIS — F17210 Nicotine dependence, cigarettes, uncomplicated: Secondary | ICD-10-CM | POA: Insufficient documentation

## 2018-08-19 DIAGNOSIS — Z20828 Contact with and (suspected) exposure to other viral communicable diseases: Secondary | ICD-10-CM | POA: Insufficient documentation

## 2018-08-19 LAB — CBC WITH DIFFERENTIAL/PLATELET
Abs Immature Granulocytes: 0.05 K/uL (ref 0.00–0.07)
Basophils Absolute: 0.1 K/uL (ref 0.0–0.1)
Basophils Relative: 1 %
Eosinophils Absolute: 0.1 K/uL (ref 0.0–0.5)
Eosinophils Relative: 1 %
HCT: 40.1 % (ref 36.0–46.0)
Hemoglobin: 13.5 g/dL (ref 12.0–15.0)
Immature Granulocytes: 1 %
Lymphocytes Relative: 19 %
Lymphs Abs: 1.7 K/uL (ref 0.7–4.0)
MCH: 29.5 pg (ref 26.0–34.0)
MCHC: 33.7 g/dL (ref 30.0–36.0)
MCV: 87.6 fL (ref 80.0–100.0)
Monocytes Absolute: 0.5 K/uL (ref 0.1–1.0)
Monocytes Relative: 5 %
Neutro Abs: 7 K/uL (ref 1.7–7.7)
Neutrophils Relative %: 73 %
Platelets: 267 K/uL (ref 150–400)
RBC: 4.58 MIL/uL (ref 3.87–5.11)
RDW: 13.5 % (ref 11.5–15.5)
WBC: 9.4 K/uL (ref 4.0–10.5)
nRBC: 0 % (ref 0.0–0.2)

## 2018-08-19 LAB — BASIC METABOLIC PANEL WITH GFR
Anion gap: 11 (ref 5–15)
BUN: 10 mg/dL (ref 6–20)
CO2: 21 mmol/L — ABNORMAL LOW (ref 22–32)
Calcium: 9 mg/dL (ref 8.9–10.3)
Chloride: 106 mmol/L (ref 98–111)
Creatinine, Ser: 0.81 mg/dL (ref 0.44–1.00)
GFR calc Af Amer: >60 mL/min
GFR calc non Af Amer: >60 mL/min
Glucose, Bld: 129 mg/dL — ABNORMAL HIGH (ref 70–99)
Potassium: 3.9 mmol/L (ref 3.5–5.1)
Sodium: 138 mmol/L (ref 135–145)

## 2018-08-19 LAB — SARS CORONAVIRUS 2 BY RT PCR (HOSPITAL ORDER, PERFORMED IN ~~LOC~~ HOSPITAL LAB): SARS Coronavirus 2: NEGATIVE

## 2018-08-19 MED ORDER — PREDNISONE 10 MG (21) PO TBPK: ORAL_TABLET | Freq: Every day | ORAL | 0 refills | Status: DC

## 2018-08-19 MED ORDER — PREDNISONE 20 MG PO TABS
60.0000 mg | ORAL_TABLET | Freq: Once | ORAL | Status: AC
  Administered 2018-08-19: 60 mg via ORAL
  Filled 2018-08-19: qty 3

## 2018-08-19 MED ORDER — IPRATROPIUM-ALBUTEROL 0.5-2.5 (3) MG/3ML IN SOLN
3.0000 mL | Freq: Once | RESPIRATORY_TRACT | Status: AC
  Administered 2018-08-19: 15:00:00 3 mL via RESPIRATORY_TRACT
  Filled 2018-08-19: qty 3

## 2018-08-19 MED ORDER — LEVOFLOXACIN 750 MG PO TABS
750.0000 mg | ORAL_TABLET | Freq: Once | ORAL | Status: AC
  Administered 2018-08-19: 750 mg via ORAL
  Filled 2018-08-19: qty 1

## 2018-08-19 MED ORDER — LEVOFLOXACIN 500 MG PO TABS: 500.0000 mg | ORAL_TABLET | Freq: Every day | ORAL | 0 refills | Status: AC

## 2018-08-19 NOTE — ED Provider Notes (Signed)
[facility] Emergency Department Provider Note       Time seen: ----------------------------------------- 2:26 PM on 08/19/2018 -----------------------------------------   I have reviewed the triage vital signs and the nursing notes.  HISTORY   Chief Complaint Cough and Fever    HPI Tina Miles is a 53 y.o. female with a history of arthritis, COPD who presents to the ED for cough with high fever and shortness of breath for 2 weeks.  Patient denies any known exposure to COVID.  Patient states she is on the left the house twice.  She has also had diarrhea.  She has a COPD history and is using 3 inhalers at home.  Past Medical History:  Diagnosis Date  . Arthritis   . COPD (chronic obstructive pulmonary disease) (HCC)     There are no active problems to display for this patient.   Past Surgical History:  Procedure Laterality Date  . CORONARY ANGIOPLASTY WITH STENT PLACEMENT      Allergies Novocain [procaine] and Sulfa antibiotics  Social History Social History   Tobacco Use  . Smoking status: Current Every Day Smoker    Packs/day: 1.00    Types: Cigarettes  . Smokeless tobacco: Never Used  Substance Use Topics  . Alcohol use: No  . Drug use: Not on file    Review of Systems Constitutional: Negative for fever. Cardiovascular: Negative for chest pain. Respiratory: Positive for shortness of breath Gastrointestinal: Negative for abdominal pain, positive for diarrhea Musculoskeletal: Negative for back pain. Skin: Negative for rash. Neurological: Negative for headaches, focal weakness or numbness.  All systems negative/normal/unremarkable except as stated in the HPI  ____________________________________________   PHYSICAL EXAM:  VITAL SIGNS: ED Triage Vitals  Enc Vitals Group     BP 08/19/18 1320 125/80     Pulse Rate 08/19/18 1320 100     Resp 08/19/18 1320 (!) 26     Temp 08/19/18 1320 97.6 F (36.4 C)     Temp Source  08/19/18 1320 Oral     SpO2 08/19/18 1320 97 %     Weight 08/19/18 1314 160 lb (72.6 kg)     Height 08/19/18 1314 5\' 2"  (1.575 m)     Head Circumference --      Peak Flow --      Pain Score 08/19/18 1314 0     Pain Loc --      Pain Edu? --      Excl. in GC? --    Constitutional: Alert and oriented. Well appearing and in no distress. Eyes: Conjunctivae are normal. Normal extraocular movements. ENT      Head: Normocephalic and atraumatic.      Nose: No congestion/rhinnorhea.      Mouth/Throat: Mucous membranes are moist.      Neck: No stridor. Cardiovascular: Normal rate, regular rhythm. No murmurs, rubs, or gallops. Respiratory: No tachypnea, wheezing and rhonchi bilaterally Gastrointestinal: Soft and nontender. Normal bowel sounds Musculoskeletal: Nontender with normal range of motion in extremities. No lower extremity tenderness nor edema. Neurologic:  Normal speech and language. No gross focal neurologic deficits are appreciated.  Skin:  Skin is warm, dry and intact. No rash noted. Psychiatric: Mood and affect are normal. Speech and behavior are normal.  ____________________________________________  EKG: Interpreted by me.  Sinus rhythm with rate of 93 bpm, normal PR interval, normal QRS, normal QT  ____________________________________________  ED COURSE:  As part of my medical decision making, I reviewed the following data within the electronic medical  record:  History obtained from family if available, nursing notes, old chart and ekg, as well as notes from prior ED visits. Patient presented for likely COPD exacerbation, we will assess with labs and imaging as indicated at this time.   Procedures  Tina Miles was evaluated in Emergency Department on 08/19/2018 for the symptoms described in the history of present illness. She was evaluated in the context of the global COVID-19 pandemic, which necessitated consideration that the patient might be at risk for infection with the  SARS-CoV-2 virus that causes COVID-19. Institutional protocols and algorithms that pertain to the evaluation of patients at risk for COVID-19 are in a state of rapid change based on information released by regulatory bodies including the CDC and federal and state organizations. These policies and algorithms were followed during the patient's care in the ED.  ____________________________________________   LABS (pertinent positives/negatives)  Labs Reviewed  BASIC METABOLIC PANEL - Abnormal; Notable for the following components:      Result Value   CO2 21 (*)    Glucose, Bld 129 (*)    All other components within normal limits  SARS CORONAVIRUS 2 (HOSPITAL ORDER, [place] [facility])  CBC WITH DIFFERENTIAL/PLATELET    RADIOLOGY  Chest x-ray is unremarkable  ____________________________________________   DIFFERENTIAL DIAGNOSIS   COPD, pneumonia, COVID-19  FINAL ASSESSMENT AND PLAN  COPD exacerbation   Plan: The patient had presented for COPD exacerbation. Patient's labs were reassuring. Patient's imaging was unremarkable.  She was given duo nebs as well as oral steroids and oral Levaquin.  We have done a COVID-19 test which will result tomorrow.  Overall she appears improved, does not require oxygen and is cleared for outpatient follow-up.   Laurence Aly, MD    Note: This note was generated in part or whole with voice recognition software. Voice recognition is usually quite accurate but there are transcription errors that can and very often do occur. I apologize for any typographical errors that were not detected and corrected.     Earleen Newport, MD 08/19/18 [phone]

## 2018-08-19 NOTE — ED Triage Notes (Signed)
Pt here for cough, fever tmax 102, and SHOB for 2 weeks.  Denies known exposure to covid and only left house X 2.  Also has had diarrhea.  COPD hx and using 3 inhalers at home.  Mildly labored in triage.  Reports nothing will come up when coughs.

## 2019-03-14 ENCOUNTER — Emergency Department
Admission: EM | Admit: 2019-03-14 | Discharge: 2019-03-14 | Disposition: A | Discharge status: Home / Self Care | Payer: Medicaid Other | Attending: Emergency Medicine | Admitting: Emergency Medicine

## 2019-03-14 ENCOUNTER — Other Ambulatory Visit: Payer: Self-pay

## 2019-03-14 ENCOUNTER — Encounter: Payer: Self-pay | Admitting: Emergency Medicine

## 2019-03-14 DIAGNOSIS — F17210 Nicotine dependence, cigarettes, uncomplicated: Secondary | ICD-10-CM | POA: Insufficient documentation

## 2019-03-14 DIAGNOSIS — J449 Chronic obstructive pulmonary disease, unspecified: Secondary | ICD-10-CM | POA: Insufficient documentation

## 2019-03-14 DIAGNOSIS — Z955 Presence of coronary angioplasty implant and graft: Secondary | ICD-10-CM | POA: Diagnosis not present

## 2019-03-14 DIAGNOSIS — R519 Headache, unspecified: Secondary | ICD-10-CM | POA: Diagnosis not present

## 2019-03-14 DIAGNOSIS — N3941 Urge incontinence: Secondary | ICD-10-CM

## 2019-03-14 DIAGNOSIS — R040 Epistaxis: Secondary | ICD-10-CM | POA: Insufficient documentation

## 2019-03-14 LAB — URINALYSIS, COMPLETE (UACMP) WITH MICROSCOPIC
Bacteria, UA: NONE SEEN
Bilirubin Urine: NEGATIVE
Glucose, UA: NEGATIVE mg/dL
Hgb urine dipstick: NEGATIVE
Ketones, ur: NEGATIVE mg/dL
Leukocytes,Ua: NEGATIVE
Nitrite: NEGATIVE
Protein, ur: NEGATIVE mg/dL
Specific Gravity, Urine: 1.011 (ref 1.005–1.030)
pH: 5 (ref 5.0–8.0)

## 2019-03-14 LAB — CBC WITH DIFFERENTIAL/PLATELET
Abs Immature Granulocytes: 0.02 10*3/uL (ref 0.00–0.07)
Basophils Absolute: 0.1 10*3/uL (ref 0.0–0.1)
Basophils Relative: 1 %
Eosinophils Absolute: 0.3 10*3/uL (ref 0.0–0.5)
Eosinophils Relative: 4 %
HCT: 41.3 % (ref 36.0–46.0)
Hemoglobin: 13.7 g/dL (ref 12.0–15.0)
Immature Granulocytes: 0 %
Lymphocytes Relative: 35 %
Lymphs Abs: 2.4 10*3/uL (ref 0.7–4.0)
MCH: 29.8 pg (ref 26.0–34.0)
MCHC: 33.2 g/dL (ref 30.0–36.0)
MCV: 89.8 fL (ref 80.0–100.0)
Monocytes Absolute: 0.4 10*3/uL (ref 0.1–1.0)
Monocytes Relative: 5 %
Neutro Abs: 3.6 10*3/uL (ref 1.7–7.7)
Neutrophils Relative %: 55 %
Platelets: 201 10*3/uL (ref 150–400)
RBC: 4.6 MIL/uL (ref 3.87–5.11)
RDW: 14.6 % (ref 11.5–15.5)
WBC: 6.7 10*3/uL (ref 4.0–10.5)
nRBC: 0 % (ref 0.0–0.2)

## 2019-03-14 LAB — BASIC METABOLIC PANEL
Anion gap: 8 (ref 5–15)
BUN: 8 mg/dL (ref 6–20)
CO2: 27 mmol/L (ref 22–32)
Calcium: 9.3 mg/dL (ref 8.9–10.3)
Chloride: 106 mmol/L (ref 98–111)
Creatinine, Ser: 0.85 mg/dL (ref 0.44–1.00)
GFR calc Af Amer: >60 mL/min (ref >60)
GFR calc non Af Amer: >60 mL/min (ref >60)
Glucose, Bld: 132 mg/dL — ABNORMAL HIGH (ref 70–99)
Potassium: 4.2 mmol/L (ref 3.5–5.1)
Sodium: 141 mmol/L (ref 135–145)

## 2019-03-14 MED ORDER — BUTALBITAL-APAP-CAFFEINE 50-325-40 MG PO TABS: 1.0000 | ORAL_TABLET | Freq: Three times a day (TID) | ORAL | 0 refills | Status: AC | PRN

## 2019-03-14 NOTE — ED Notes (Signed)
Pt with c/o of nose bleed that has been coming and going since Thursday and constant headache. No bleeding at this time. Pt states headache at this time.

## 2019-03-14 NOTE — ED Provider Notes (Signed)
[facility] Emergency Department Provider Note ____________________________________________  Time seen: 1714  I have reviewed the triage vital signs and the nursing notes.  HISTORY  Chief Complaint  Headache  HPI Tina Miles is a 54 y.o. female presents himself to the ED for evaluation of intermittent nosebleed over the last 3 to 4 days.  Patient also describes a frontal headache but denies any visual disturbance, weakness, syncope, or fevers.  She denies any chest pain, shortness of breath, or wheezing.  Patient has a history of COPD as well as overactive bladder.  She is described daily nosebleeds that she is been able to manage with manual compression, ice packs, and holding her head back.  She denies taking any over-the-counter medications or any medications by the nose.  She also is reporting some urge incontinence but denies any hematuria, pelvic pain, or back pain.  She takes medications for her COPD but does not take any blood thinners.   Past Medical History:  Diagnosis Date  . Arthritis   . COPD (chronic obstructive pulmonary disease) (HCC)     There are no problems to display for this patient.   Past Surgical History:  Procedure Laterality Date  . CORONARY ANGIOPLASTY WITH STENT PLACEMENT      Prior to Admission medications   Medication Sig Start Date End Date Taking? Authorizing Provider  albuterol (PROVENTIL HFA;VENTOLIN HFA) 108 (90 Base) MCG/ACT inhaler Inhale into the lungs every 6 (six) hours as needed for wheezing or shortness of breath.    [provider]  benzonatate (TESSALON PERLES) 100 MG capsule Take 1 capsule (100 mg total) by mouth 3 (three) times daily as needed for cough (Take 1-2 per dose). 03/08/17   Monnica Saltsman, Charlesetta Ivory, PA-C  brompheniramine-pseudoephedrine-DM 30-2-10 MG/5ML syrup Take 5 mLs by mouth 4 (four) times daily as needed. 01/30/16   Joni Reining, PA-C  clonazePAM (KLONOPIN) 1 MG tablet Take 1 mg by mouth 3  (three) times daily as needed for anxiety.    [provider]  doxepin (SINEQUAN) 150 MG capsule Take 150 mg by mouth at bedtime.    [provider]  guaiFENesin-codeine 100-10 MG/5ML syrup Take 5 mLs by mouth 3 (three) times daily as needed for cough. 03/08/17   Will Schier, Charlesetta Ivory, PA-C  ibuprofen (ADVIL,MOTRIN) 600 MG tablet Take 1 tablet (600 mg total) by mouth every 8 (eight) hours as needed. 06/10/17   Joni Reining, PA-C  levofloxacin (LEVAQUIN) 750 MG tablet Take 1 tablet (750 mg total) by mouth daily. 11/17/17   Emily Filbert, MD  oxybutynin (DITROPAN) 5 MG tablet Take 5 mg by mouth 4 (four) times daily.    [provider]  predniSONE (STERAPRED UNI-PAK 21 TAB) 10 MG (21) TBPK tablet 6-day taper as directed. 03/08/17   Hyder Deman, Charlesetta Ivory, PA-C  predniSONE (STERAPRED UNI-PAK 21 TAB) 10 MG (21) TBPK tablet Dispense taper pack as directed 11/17/17   Emily Filbert, MD  predniSONE (STERAPRED UNI-PAK 21 TAB) 10 MG (21) TBPK tablet Take by mouth daily. 08/19/18   Emily Filbert, MD  sulfamethoxazole-trimethoprim (BACTRIM DS,SEPTRA DS) 800-160 MG tablet Take 1 tablet by mouth 2 (two) times daily. 01/30/16   Joni Reining, PA-C  traMADol (ULTRAM) 50 MG tablet Take 1 tablet (50 mg total) by mouth every 6 (six) hours as needed. 02/17/15   Tommi Rumps, PA-C  venlafaxine (EFFEXOR) 75 MG tablet Take 75 mg by mouth 2 (two) times daily.  [provider]    Allergies Novocain [procaine] and Sulfa antibiotics  History reviewed. No pertinent family history.  Social History Social History   Tobacco Use  . Smoking status: Current Every Day Smoker    Packs/day: 1.00    Types: Cigarettes  . Smokeless tobacco: Never Used  Substance Use Topics  . Alcohol use: No  . Drug use: Not on file    Review of Systems  Constitutional: Negative for fever. Eyes: Negative for visual changes. ENT: Negative for sore throat. Positive for  nosebleeds Cardiovascular: Negative for chest pain. Respiratory: Negative for shortness of breath. Gastrointestinal: Negative for abdominal pain, vomiting and diarrhea. Genitourinary: Negative for dysuria. Musculoskeletal: Negative for back pain. Skin: Negative for rash. Neurological: Negative for focal weakness or numbness. Reports headaches ____________________________________________  PHYSICAL EXAM:  VITAL SIGNS: ED Triage Vitals [03/14/19 1637]  Enc Vitals Group     BP 134/77     Pulse Rate 96     Resp 18     Temp 98.6 F (37 C)     Temp Source Oral     SpO2 98 %     Weight 160 lb (72.6 kg)     Height 5\' 5"  (1.651 m)     Head Circumference      Peak Flow      Pain Score 7     Pain Loc      Pain Edu?      Excl. in [place]?     Constitutional: Alert and oriented. Well appearing and in no distress. Head: Normocephalic and atraumatic. Eyes: Conjunctivae are normal. PERRL. Normal extraocular movements Ears: Canals clear. TMs intact bilaterally. Nose: No congestion/rhinorrhea/epistaxis. No active bleeding. No lesions.  Cardiovascular: Normal rate, regular rhythm. Normal distal pulses. Respiratory: Normal respiratory effort. No wheezes/rales/rhonchi. Gastrointestinal: Soft and nontender. No distention. No CVA tenderness Musculoskeletal: Nontender with normal range of motion in all extremities.  Neurologic:  Normal gait without ataxia. Normal speech and language. No gross focal neurologic deficits are appreciated. ____________________________________________   LABS (pertinent positives/negatives) Labs Reviewed  BASIC METABOLIC PANEL - Abnormal; Notable for the following components:      Result Value   Glucose, Bld 132 (*)    All other components within normal limits  URINALYSIS, COMPLETE (UACMP) WITH MICROSCOPIC - Abnormal; Notable for the following components:   Color, Urine YELLOW (*)    APPearance CLEAR (*)    All other components within normal limits  CBC WITH  DIFFERENTIAL/PLATELET  ____________________________________________  PROCEDURES  Procedures ____________________________________________  INITIAL IMPRESSION / ASSESSMENT AND PLAN / ED COURSE  DDX: headache, temporal arteritis, epistaxis, sinusitis, UTI, pyelonephritis  Patient with ED evaluation of multiple complaints including daily nosebleeds for the last several days.  Patient also reports frontal headache without sequelae or visual disturbance.  Her exam is overall benign return at this time.  Labs not reveal any acute abnormalities.  No indication of a UTI on exam.  Patient is advised on self management of acute nosebleeds, and is given a nose clip to help manage.  She is also reassured that her labs not indicate any acute anemia, thrombocytosis, or infection.  Patient will use over-the-counter saline mist to moisturize the nose.  She will follow-up with her primary provider for ongoing symptoms or return to the ED if necessary.  Tina Miles was evaluated in Emergency Department on 03/14/2019 for the symptoms described in the history of present illness. She was evaluated in the context of the global COVID-19 pandemic, which necessitated  consideration that the patient might be at risk for infection with the SARS-CoV-2 virus that causes COVID-19. Institutional protocols and algorithms that pertain to the evaluation of patients at risk for COVID-19 are in a state of rapid change based on information released by regulatory bodies including the CDC and federal and state organizations. These policies and algorithms were followed during the patient's care in the ED. ____________________________________________  FINAL CLINICAL IMPRESSION(S) / ED DIAGNOSES  Final diagnoses:  Acute intractable headache, unspecified headache type  Nosebleed  Urge incontinence of urine      Miaisabella Bacorn, Charlesetta Ivory, PA-C 03/14/19 1853    Charlynne Pander, MD 03/14/19 [phone]

## 2019-03-14 NOTE — ED Triage Notes (Signed)
Pt to triage with c/o headache and intermittent nosebleed since Thursday.  No bleeding noted at present.

## 2019-03-14 NOTE — Discharge Instructions (Signed)
Your exam and labs are normal at this time. There is no indication of a serious cause for your headaches or nosebleeds. You should follow-up with your primary provider for ongoing symptoms. Consider using a warm humidifier overnight. You should also use OTC saline mist to the nose.

## 2019-04-19 ENCOUNTER — Emergency Department: Payer: Medicaid Other

## 2019-04-19 ENCOUNTER — Other Ambulatory Visit: Payer: Self-pay

## 2019-04-19 ENCOUNTER — Encounter: Payer: Self-pay | Admitting: Emergency Medicine

## 2019-04-19 ENCOUNTER — Emergency Department
Admission: EM | Admit: 2019-04-19 | Discharge: 2019-04-19 | Disposition: A | Discharge status: Home / Self Care | Payer: Medicaid Other | Attending: Emergency Medicine | Admitting: Emergency Medicine

## 2019-04-19 DIAGNOSIS — M1990 Unspecified osteoarthritis, unspecified site: Secondary | ICD-10-CM | POA: Diagnosis not present

## 2019-04-19 DIAGNOSIS — F17210 Nicotine dependence, cigarettes, uncomplicated: Secondary | ICD-10-CM | POA: Insufficient documentation

## 2019-04-19 DIAGNOSIS — M25511 Pain in right shoulder: Secondary | ICD-10-CM | POA: Diagnosis present

## 2019-04-19 MED ORDER — KETOROLAC TROMETHAMINE 30 MG/ML IJ SOLN
30.0000 mg | Freq: Once | INTRAMUSCULAR | Status: AC
  Administered 2019-04-19: 30 mg via INTRAMUSCULAR
  Filled 2019-04-19: qty 1

## 2019-04-19 MED ORDER — PREDNISONE 50 MG PO TABS: ORAL_TABLET | ORAL | 0 refills | Status: AC

## 2019-04-19 NOTE — ED Provider Notes (Signed)
Emergency Department Provider Note  ____________________________________________  Time seen: Approximately 6:32 PM  I have reviewed the triage vital signs and the nursing notes.   HISTORY  Chief Complaint Shoulder Pain   Historian Patient     HPI Tina Miles is a 54 y.o. female presents to the emergency department with acute right shoulder pain.  Patient states that 5 days ago, she reached behind her after she dropped something behind the bed.  Patient states that she immediately experienced a cramp in her shoulder and has discomfort since incident occurred.  She has had difficulty with overhead activities and reaching behind her.  No numbness or tingling in the hand.  She reports that her pain does radiate along the right upper arm but stops at the elbow.  She has never had similar discomfort in the past.   Past Medical History:  Diagnosis Date  . Arthritis   . COPD (chronic obstructive pulmonary disease) ([place])      Immunizations up to date:  Yes.     Past Medical History:  Diagnosis Date  . Arthritis   . COPD (chronic obstructive pulmonary disease) (HCC)     There are no problems to display for this patient.   Past Surgical History:  Procedure Laterality Date  . CORONARY ANGIOPLASTY WITH STENT PLACEMENT      Prior to Admission medications   Medication Sig Start Date End Date Taking? Authorizing Provider  albuterol (PROVENTIL HFA;VENTOLIN HFA) 108 (90 Base) MCG/ACT inhaler Inhale into the lungs every 6 (six) hours as needed for wheezing or shortness of breath.    [provider]  clonazePAM (KLONOPIN) 1 MG tablet Take 1 mg by mouth 3 (three) times daily as needed for anxiety.    [provider]  doxepin (SINEQUAN) 150 MG capsule Take 150 mg by mouth at bedtime.    [provider]  ibuprofen (ADVIL,MOTRIN) 600 MG tablet Take 1 tablet (600 mg total) by mouth every 8 (eight) hours as needed. 06/10/17   Sable Feil, PA-C  oxybutynin  (DITROPAN) 5 MG tablet Take 5 mg by mouth 4 (four) times daily.    [provider]  predniSONE (DELTASONE) 50 MG tablet Take one tablet once daily for the next five days. 04/19/19   Lannie Fields, PA-C  venlafaxine (EFFEXOR) 75 MG tablet Take 75 mg by mouth 2 (two) times daily.    [provider]    Allergies Novocain [procaine] and Sulfa antibiotics  History reviewed. No pertinent family history.  Social History Social History   Tobacco Use  . Smoking status: Current Every Day Smoker    Packs/day: 1.00    Types: Cigarettes  . Smokeless tobacco: Never Used  Substance Use Topics  . Alcohol use: No  . Drug use: Not on file     Review of Systems  Constitutional: No fever/chills Eyes:  No discharge ENT: No upper respiratory complaints. Respiratory: no cough. No SOB/ use of accessory muscles to breath Gastrointestinal:   No nausea, no vomiting.  No diarrhea.  No constipation. Musculoskeletal: Patient has right shoulder pain.  Skin: Negative for rash, abrasions, lacerations, ecchymosis.    ____________________________________________   PHYSICAL EXAM:  VITAL SIGNS: ED Triage Vitals  Enc Vitals Group     BP 04/19/19 1728 115/78     Pulse Rate 04/19/19 1728 91     Resp 04/19/19 1728 20     Temp 04/19/19 1728 99.3 F (37.4 C)     Temp Source 04/19/19 1728 Oral  SpO2 04/19/19 1728 100 %     Weight 04/19/19 1729 160 lb (72.6 kg)     Height 04/19/19 1729 5\' 4"  (1.626 m)     Head Circumference --      Peak Flow --      Pain Score 04/19/19 1729 10     Pain Loc --      Pain Edu? --      Excl. in [place]? --      Constitutional: Alert and oriented. Well appearing and in no acute distress. Eyes: Conjunctivae are normal. PERRL. EOMI. Head: Atraumatic. Cardiovascular: Normal rate, regular rhythm. Normal S1 and S2.  Good peripheral circulation. Respiratory: Normal respiratory effort without tachypnea or retractions. Lungs CTAB. Good air entry to the bases  with no decreased or absent breath sounds Gastrointestinal: Bowel sounds x 4 quadrants. Soft and nontender to palpation. No guarding or rigidity. No distention. Musculoskeletal: Patient unable to perform full range of motion at right shoulder, likely secondary to pain.  She does have tenderness to palpation over insertion for supraspinatus. Neurologic:  Normal for age. No gross focal neurologic deficits are appreciated.  Skin:  Skin is warm, dry and intact. No rash noted. Psychiatric: Mood and affect are normal for age. Speech and behavior are normal.   ____________________________________________   LABS (all labs ordered are listed, but only abnormal results are displayed)  Labs Reviewed - No data to display ____________________________________________  EKG   ____________________________________________  RADIOLOGY 06/19/19, personally viewed and evaluated these images (plain radiographs) as part of my medical decision making, as well as reviewing the written report by the radiologist.  DG Shoulder Right  Result Date: 04/19/2019 CLINICAL DATA:  RIGHT shoulder pain after reaching for something behind headboard, pain with sleeping on it or movement, history of arthritis, COPD, smoker EXAM: RIGHT SHOULDER - 2+ VIEW COMPARISON:  None FINDINGS: Osseous demineralization. AC joint alignment normal. Visualized ribs intact. No acute fracture, dislocation, or bone destruction. IMPRESSION: No acute osseous abnormalities. Electronically Signed   By: 06/19/2019 M.D.   On: 04/19/2019 18:29    ____________________________________________    PROCEDURES  Procedure(s) performed:     Procedures     Medications  ketorolac (TORADOL) 30 MG/ML injection 30 mg (has no administration in time range)     ____________________________________________   INITIAL IMPRESSION / ASSESSMENT AND PLAN / ED COURSE  Pertinent labs & imaging results that were available during my care of the  patient were reviewed by me and considered in my medical decision making (see chart for details).      Assessment and plan Right shoulder pain 54 year old female presents to the emergency department with acute right shoulder pain.  Suspect rotator cuff strain.  A sling was provided in the emergency department and Toradol was given for pain.  Patient was started on a 5-day course of prednisone and Robaxin.  She was advised to follow-up with primary care if symptoms persist.  All patient questions were answered.   ____________________________________________  FINAL CLINICAL IMPRESSION(S) / ED DIAGNOSES  Final diagnoses:  Acute pain of right shoulder      NEW MEDICATIONS STARTED DURING THIS VISIT:  ED Discharge Orders         Ordered    predniSONE (DELTASONE) 50 MG tablet     04/19/19 1838              This chart was dictated using voice recognition software/Dragon. Despite best efforts to proofread, errors can occur which can  change the meaning. Any change was purely unintentional.     Orvil Feil, PA-C 04/19/19 Jaye Beagle    Shaune Pollack, MD 04/21/19 2042

## 2019-04-19 NOTE — ED Triage Notes (Signed)
Pt presents to ED with c/o reproduceable R shoulder pain after reaching for something behind her headboard. Pt states she thinks she hurt her shoulder at that time, c/o pain when sleeping on it or with movement.    Pt in NAD upon arrival to triage, pt drinking drink upon arrival.

## 2019-09-22 ENCOUNTER — Emergency Department
Admission: EM | Admit: 2019-09-22 | Discharge: 2019-09-22 | Discharge status: Against Medical Advice | Payer: Medicaid Other
